# Patient Record
Sex: Male | Born: 1970 | Race: White | Hispanic: No | Smoking: Current every day smoker
Health system: Southern US, Community
[De-identification: ages and names within clinical notes are randomized; demographics above are authoritative.]

## PROBLEM LIST (undated history)

## (undated) DIAGNOSIS — K219 Gastro-esophageal reflux disease without esophagitis: Secondary | ICD-10-CM

## (undated) DIAGNOSIS — I10 Essential (primary) hypertension: Secondary | ICD-10-CM

## (undated) HISTORY — PX: NASAL SINUS SURGERY: SHX719

## (undated) HISTORY — PX: CERVICAL FUSION: SHX112

---

## 2007-05-10 ENCOUNTER — Ambulatory Visit: Payer: Self-pay | Admitting: Family Medicine

## 2007-05-10 DIAGNOSIS — M25529 Pain in unspecified elbow: Secondary | ICD-10-CM | POA: Insufficient documentation

## 2007-05-10 DIAGNOSIS — E162 Hypoglycemia, unspecified: Secondary | ICD-10-CM

## 2007-05-11 ENCOUNTER — Encounter: Payer: Self-pay | Admitting: Family Medicine

## 2007-05-11 LAB — CONVERTED CEMR LAB
Alkaline Phosphatase: 62 units/L (ref 39–117)
BUN: 14 mg/dL (ref 6–23)
CO2: 25 meq/L (ref 19–32)
Cholesterol: 199 mg/dL (ref 0–200)
Creatinine, Ser: 1.07 mg/dL (ref 0.40–1.50)
Glucose, Bld: 104 mg/dL — ABNORMAL HIGH (ref 70–99)
HDL: 29 mg/dL — ABNORMAL LOW (ref >39)
LDL Cholesterol: 158 mg/dL — ABNORMAL HIGH (ref 0–99)
Total Bilirubin: 0.6 mg/dL (ref 0.3–1.2)
Total Protein: 7.9 g/dL (ref 6.0–8.3)
Triglycerides: 61 mg/dL (ref <150)
VLDL: 12 mg/dL (ref 0–40)

## 2007-05-12 ENCOUNTER — Encounter: Payer: Self-pay | Admitting: Family Medicine

## 2007-05-18 ENCOUNTER — Ambulatory Visit: Payer: Self-pay | Admitting: Family Medicine

## 2007-05-31 ENCOUNTER — Ambulatory Visit: Payer: Self-pay | Admitting: Family Medicine

## 2007-06-28 ENCOUNTER — Ambulatory Visit: Payer: Self-pay | Admitting: Family Medicine

## 2007-06-30 ENCOUNTER — Telehealth (INDEPENDENT_AMBULATORY_CARE_PROVIDER_SITE_OTHER): Payer: Self-pay | Admitting: *Deleted

## 2007-06-30 ENCOUNTER — Encounter: Payer: Self-pay | Admitting: Family Medicine

## 2007-06-30 LAB — CONVERTED CEMR LAB: HCV Ab: NEGATIVE

## 2007-07-03 ENCOUNTER — Telehealth: Payer: Self-pay | Admitting: Family Medicine

## 2007-07-03 LAB — CONVERTED CEMR LAB
ALT: 91 units/L — ABNORMAL HIGH (ref 0–53)
GC Probe Amp, Urine: NEGATIVE

## 2007-07-06 ENCOUNTER — Encounter: Payer: Self-pay | Admitting: Family Medicine

## 2007-11-07 ENCOUNTER — Telehealth: Payer: Self-pay | Admitting: Family Medicine

## 2007-11-07 DIAGNOSIS — E785 Hyperlipidemia, unspecified: Secondary | ICD-10-CM

## 2007-11-15 ENCOUNTER — Encounter: Payer: Self-pay | Admitting: Family Medicine

## 2007-11-15 LAB — CONVERTED CEMR LAB
Cholesterol: 160 mg/dL (ref 0–200)
LDL Cholesterol: 101 mg/dL — ABNORMAL HIGH (ref 0–99)
Triglycerides: 65 mg/dL (ref <150)
VLDL: 13 mg/dL (ref 0–40)

## 2007-11-17 ENCOUNTER — Encounter: Payer: Self-pay | Admitting: Family Medicine

## 2008-01-11 ENCOUNTER — Ambulatory Visit: Payer: Self-pay | Admitting: Family Medicine

## 2008-01-11 DIAGNOSIS — J1189 Influenza due to unidentified influenza virus with other manifestations: Secondary | ICD-10-CM

## 2008-01-11 DIAGNOSIS — B009 Herpesviral infection, unspecified: Secondary | ICD-10-CM | POA: Insufficient documentation

## 2008-01-15 ENCOUNTER — Encounter: Payer: Self-pay | Admitting: Family Medicine

## 2008-01-15 ENCOUNTER — Telehealth: Payer: Self-pay | Admitting: Family Medicine

## 2008-01-19 ENCOUNTER — Encounter: Payer: Self-pay | Admitting: Family Medicine

## 2008-01-19 ENCOUNTER — Telehealth: Payer: Self-pay | Admitting: Family Medicine

## 2008-01-19 ENCOUNTER — Encounter: Admission: RE | Admit: 2008-01-19 | Discharge: 2008-01-19 | Payer: Self-pay | Admitting: Family Medicine

## 2008-03-07 IMAGING — CR DG CHEST 2V
3 series · 3 of 3 positions shown · non-contrast
Comparison: none

CLINICAL DATA: Cough.  
 CHEST X-RAY:

[view not recorded (1 of 3)]
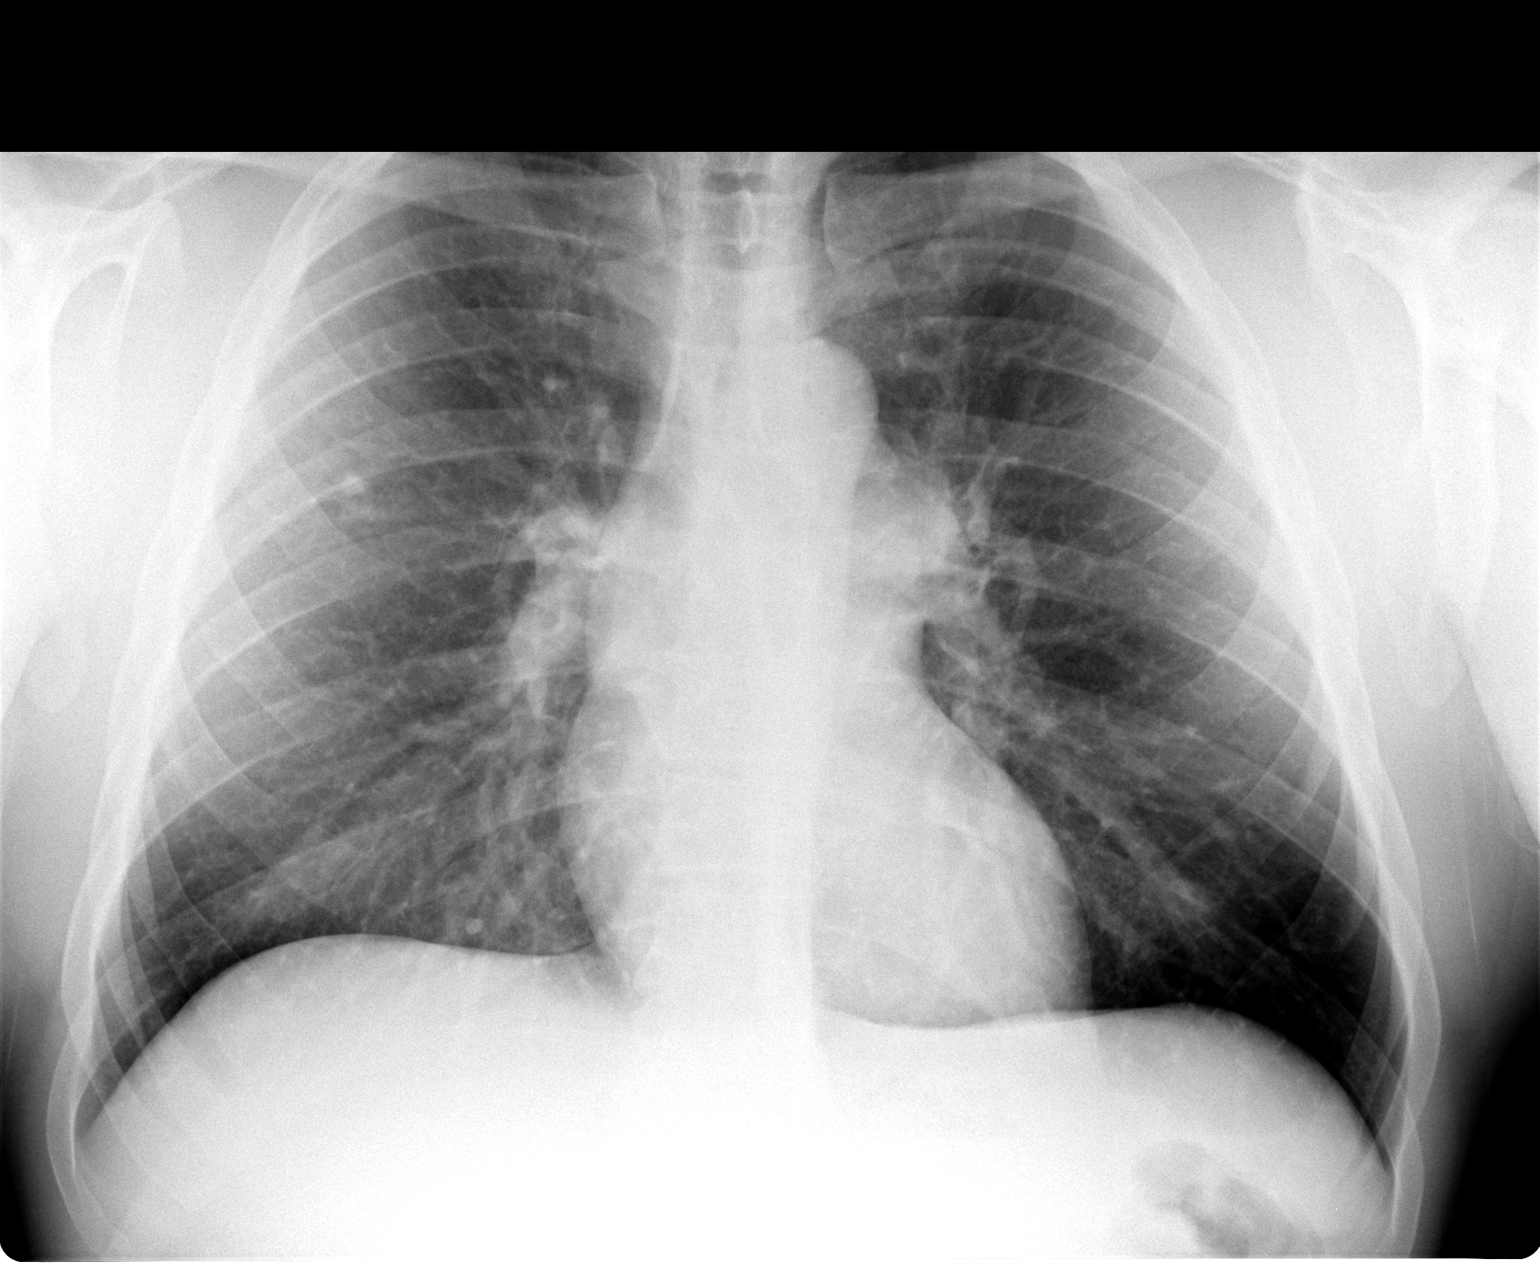

[view not recorded (2 of 3)]
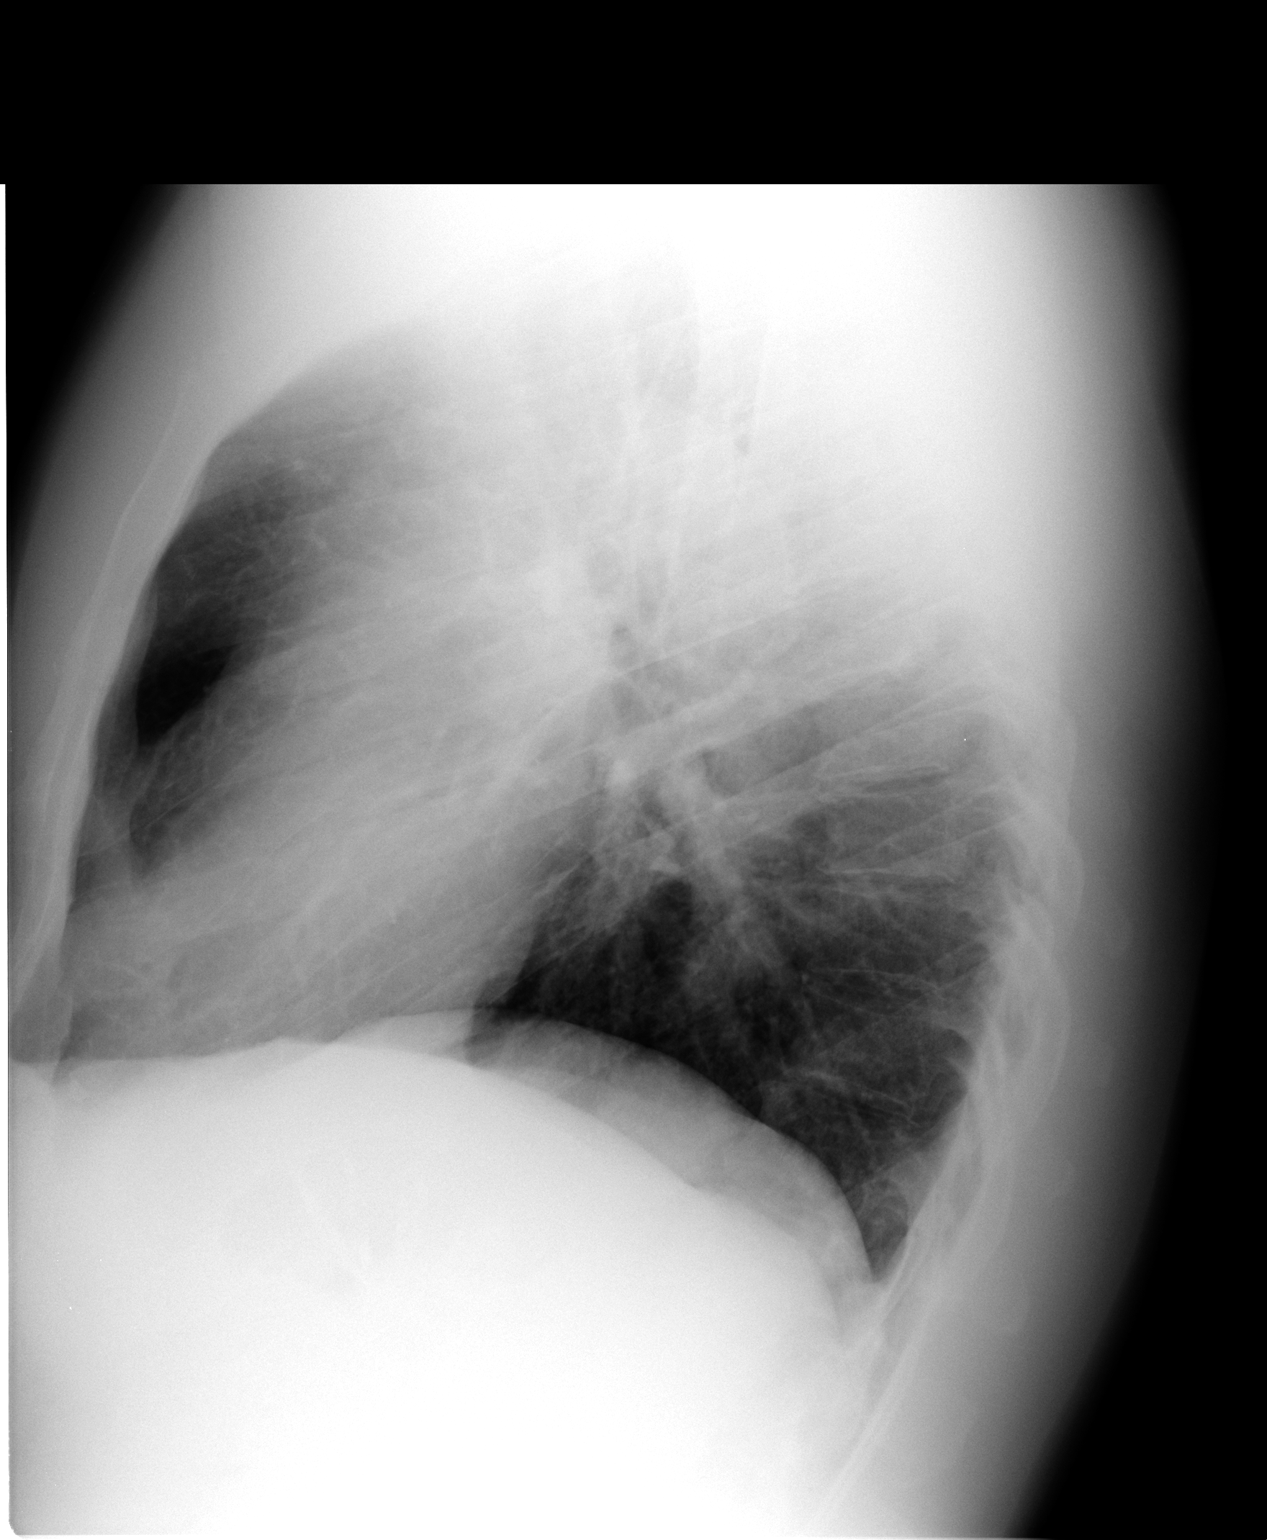

[view not recorded (3 of 3)]
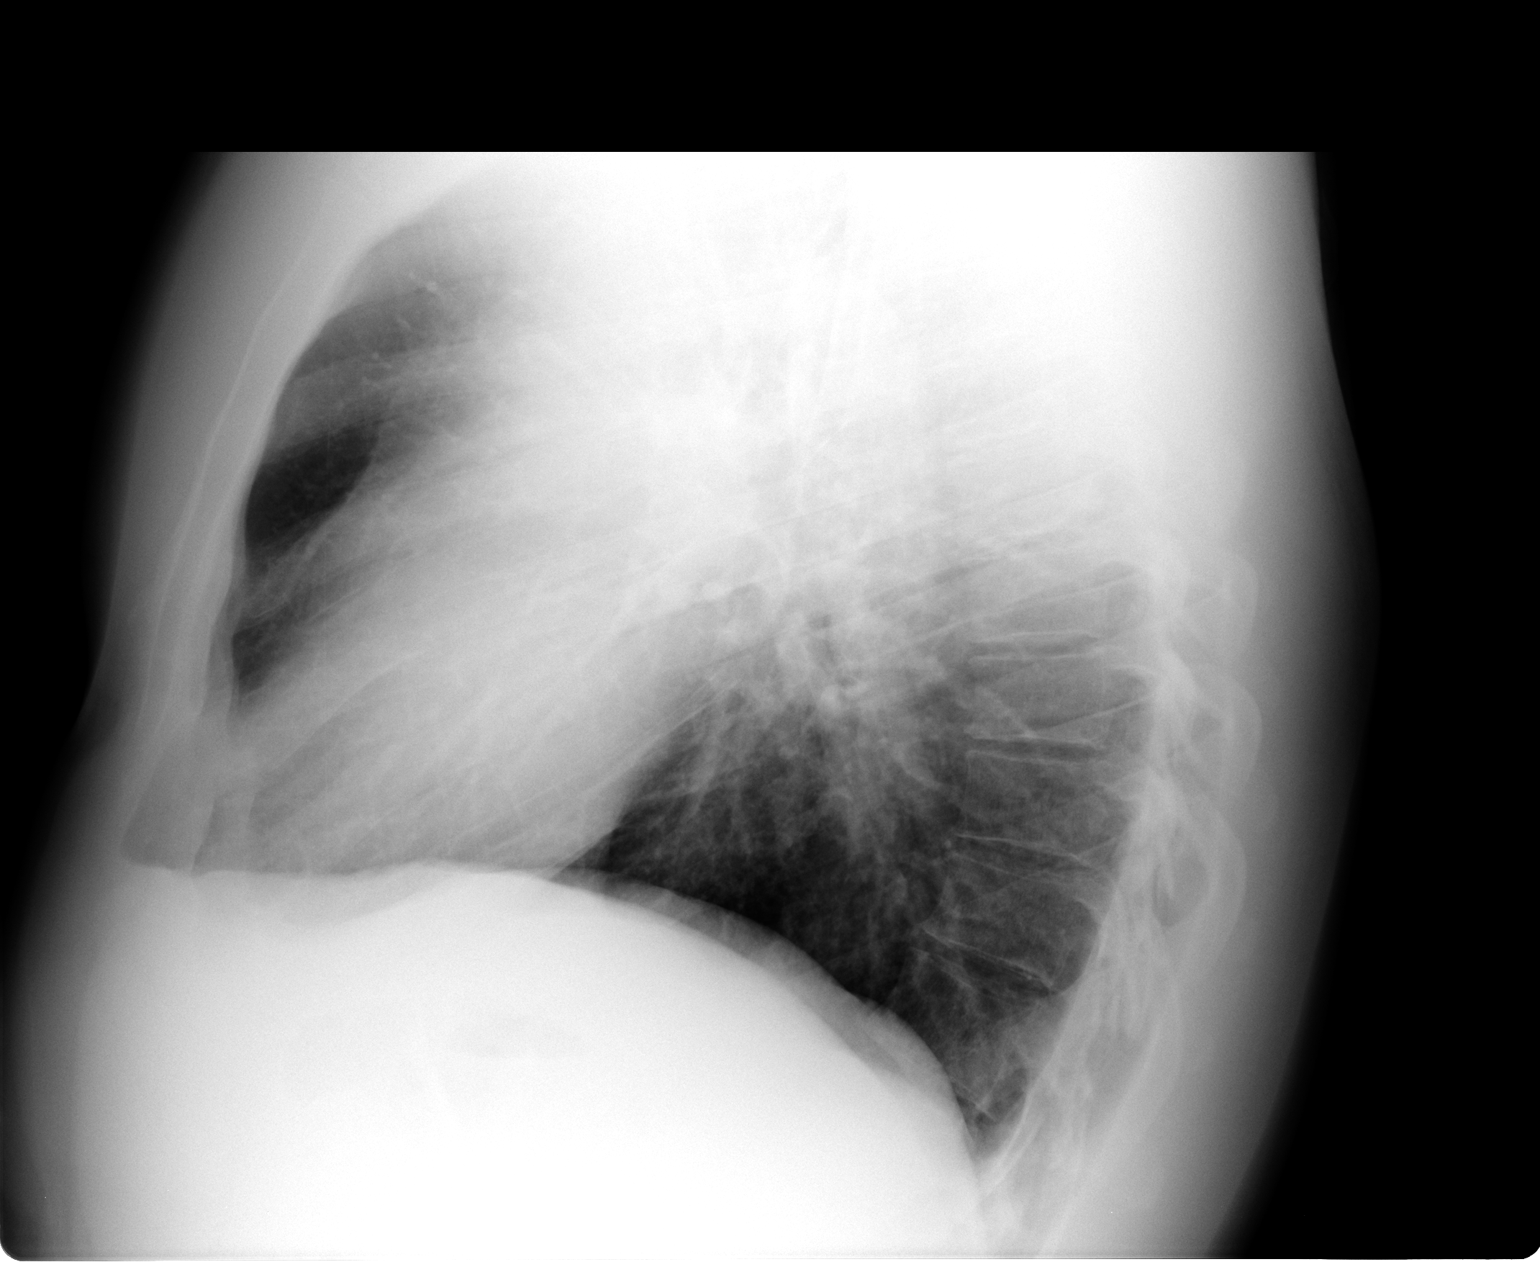

[3 of 3 positions shown; findings below may reference images not displayed]

FINDINGS: Two views of the chest show no pneumonia.  Probable granuloma is noted in the right mid lung possibly medially at the right lung base.  Recommend comparison with prior or follow up chest x-ray.  The heart is within normal limits in size.  Lower anterior cervical spine fusion plate is present.
IMPRESSION: No active lung disease.  Probable granulomas on the right.  Recommend compare with prior of follow up chest x-ray.

## 2008-04-05 ENCOUNTER — Encounter: Payer: Self-pay | Admitting: Family Medicine

## 2008-04-05 ENCOUNTER — Telehealth (INDEPENDENT_AMBULATORY_CARE_PROVIDER_SITE_OTHER): Payer: Self-pay | Admitting: *Deleted

## 2008-08-05 ENCOUNTER — Ambulatory Visit (HOSPITAL_COMMUNITY): Payer: Self-pay | Admitting: Psychiatry

## 2008-09-18 ENCOUNTER — Ambulatory Visit (HOSPITAL_COMMUNITY): Payer: Self-pay | Admitting: Psychiatry

## 2009-01-08 ENCOUNTER — Ambulatory Visit (HOSPITAL_COMMUNITY): Payer: Self-pay | Admitting: Psychiatry

## 2009-03-12 ENCOUNTER — Ambulatory Visit (HOSPITAL_COMMUNITY): Payer: Self-pay | Admitting: Psychiatry

## 2013-02-08 ENCOUNTER — Encounter: Payer: Self-pay | Admitting: *Deleted

## 2013-02-08 ENCOUNTER — Emergency Department
Admission: EM | Admit: 2013-02-08 | Discharge: 2013-02-08 | Disposition: A | Discharge status: Home / Self Care | Payer: Self-pay | Source: Home / Self Care | Attending: Family Medicine | Admitting: Family Medicine

## 2013-02-08 DIAGNOSIS — M940 Chondrocostal junction syndrome [Tietze]: Secondary | ICD-10-CM

## 2013-02-08 DIAGNOSIS — J069 Acute upper respiratory infection, unspecified: Secondary | ICD-10-CM

## 2013-02-08 HISTORY — DX: Gastro-esophageal reflux disease without esophagitis: K21.9

## 2013-02-08 MED ORDER — AZITHROMYCIN 250 MG PO TABS: ORAL_TABLET | ORAL | Status: DC

## 2013-02-08 MED ORDER — BENZONATATE 200 MG PO CAPS: 200.0000 mg | ORAL_CAPSULE | Freq: Every day | ORAL | Status: DC

## 2013-02-08 NOTE — ED Provider Notes (Signed)
History     CSN: [id number]  Arrival date & time 02/08/13  1335   First MD Initiated Contact with Patient 02/08/13 1410      Chief Complaint  Patient presents with  . Cough       HPI Comments: Patient complains of approximately 5 day history of gradually progressive URI symptoms beginning with a mild sore throat (now improved), followed by progressive nasal congestion.  A cough started next.  Complains of fatigue but no myalgias.  Cough is now worse at night and generally non-productive during the day.  There has been no shortness of breath, or wheezing, but he has noted distinct tightness in his anterior chest.  He has two daughters with similar illness.   The history is provided by the patient.    Past Medical History  Diagnosis Date  . GERD (gastroesophageal reflux disease)     Past Surgical History  Procedure Laterality Date  . Cervical fusion    . Nasal sinus surgery      History reviewed. No pertinent family history.  History  Substance Use Topics  . Smoking status: Former Games developer  . Smokeless tobacco: Not on file  . Alcohol Use: No      Review of Systems + mild sore throat + cough No pleuritic pain but has tightness in anterior chest No wheezing + nasal congestion + post-nasal drainage No sinus pain/pressure No itchy/red eyes No earache No hemoptysis No SOB + low grade fever/chills No nausea No vomiting No abdominal pain No diarrhea No urinary symptoms No skin rashes + fatigue + myalgias No headache Used OTC meds without relief  Allergies  Review of patient's allergies indicates no known allergies.  Home Medications   Current Outpatient Rx  Name  Route  Sig  Dispense  Refill  . omeprazole (PRILOSEC) 20 MG capsule   Oral   Take 20 mg by mouth daily.         Marland Kitchen azithromycin (ZITHROMAX Z-PAK) 250 MG tablet      Take 2 tabs today; then begin one tab once daily for 4 more days. (Rx void after 02/16/13)   6 each   0   . benzonatate  (TESSALON) 200 MG capsule   Oral   Take 1 capsule (200 mg total) by mouth at bedtime. Take as needed for cough   12 capsule   0     BP 113/79  Pulse 81  Temp(Src) 99 F (37.2 C) (Oral)  Ht 6\' 1"  (1.854 m)  Wt 237 lb (107.502 kg)  BMI 31.27 kg/m2  SpO2 97%  Physical Exam Nursing notes and Vital Signs reviewed. Appearance:  Patient appears healthy, stated age, and in no acute distress Eyes:  Pupils are equal, round, and reactive to light and accomodation.  Extraocular movement is intact.  Conjunctivae are not inflamed  Ears:  Canals normal.  Tympanic membranes normal.  Nose:  Mildly congested turbinates.  No sinus tenderness.  Pharynx:  Normal Neck:  Supple.  Nontender shotty posterior nodes are palpated bilaterally  Lungs:  Clear to auscultation.  Breath sounds are equal.  Chest:  Distinct tenderness to palpation over the mid-sternum.  Heart:  Regular rate and rhythm without murmurs, rubs, or gallops.  Abdomen:  Nontender without masses or hepatosplenomegaly.  Bowel sounds are present.  No CVA or flank tenderness.  Extremities:  No edema.  No calf tenderness Skin:  No rash present.   ED Course  Procedures  none      1. Acute  upper respiratory infections of unspecified site; suspect viral URI   2. Costochondritis, acute       MDM  There is no evidence of bacterial infection today.   Treat symptomatically for now.  Prescription written for Benzonatate [facility]) to take at bedtime for night-time cough.  Take Mucinex D (guaifenesin with decongestant) twice daily for congestion.  Increase fluid intake, rest. May use Afrin nasal spray (or generic oxymetazoline) twice daily for about 5 days.  Also recommend using saline nasal spray several times daily and saline nasal irrigation (AYR is a common brand) Stop all antihistamines for now, and other non-prescription cough/cold preparations. May take Ibuprofen 200mg , 4 tabs every 8 hours with food for chest/sternum  discomfort. Begin Azithromycin if not improving about 5 days or if persistent fever develops (Given a prescription to hold, with an expiration date)  Follow-up with family doctor if not improving 7 to 10 days.        Lattie Haw, MD 02/09/13 [phone]

## 2013-02-08 NOTE — ED Notes (Signed)
Pt c/o non productive cough, chest tightness, low grade fever, and body aches x 5 days. He has taken nyquil and dayquil.

## 2013-04-29 ENCOUNTER — Encounter: Payer: Self-pay | Admitting: Emergency Medicine

## 2013-04-29 ENCOUNTER — Emergency Department
Admission: EM | Admit: 2013-04-29 | Discharge: 2013-04-29 | Disposition: A | Discharge status: Home / Self Care | Payer: BC Managed Care – PPO | Source: Home / Self Care | Attending: Family Medicine | Admitting: Family Medicine

## 2013-04-29 DIAGNOSIS — J309 Allergic rhinitis, unspecified: Secondary | ICD-10-CM

## 2013-04-29 DIAGNOSIS — J3089 Other allergic rhinitis: Secondary | ICD-10-CM

## 2013-04-29 DIAGNOSIS — J0100 Acute maxillary sinusitis, unspecified: Secondary | ICD-10-CM

## 2013-04-29 HISTORY — DX: Essential (primary) hypertension: I10

## 2013-04-29 LAB — POCT RAPID STREP A (OFFICE): Rapid Strep A Screen: NEGATIVE

## 2013-04-29 MED ORDER — AMOXICILLIN 875 MG PO TABS: 875.0000 mg | ORAL_TABLET | Freq: Two times a day (BID) | ORAL | Status: DC

## 2013-04-29 MED ORDER — PREDNISONE 20 MG PO TABS: 20.0000 mg | ORAL_TABLET | Freq: Two times a day (BID) | ORAL | Status: DC

## 2013-04-29 MED ORDER — FLUTICASONE PROPIONATE 50 MCG/ACT NA SUSP: 2.0000 | Freq: Every day | NASAL | Status: DC

## 2013-04-29 MED ORDER — BENZONATATE 200 MG PO CAPS: 200.0000 mg | ORAL_CAPSULE | Freq: Every day | ORAL | Status: DC

## 2013-04-29 NOTE — ED Notes (Signed)
States he probably had sinus issues 3 weeks ago that almost cleared; presented again over the past week.

## 2013-04-29 NOTE — ED Provider Notes (Signed)
History     CSN: [id number]  Arrival date & time 04/29/13  1623   First MD Initiated Contact with Patient 04/29/13 1708      Chief Complaint  Patient presents with  . Nasal Congestion  . Cough  . Hoarse  . Generalized Body Aches       HPI Comments: Patient has a history of perennial allergies.  He had increased sinus congestion about 3 weeks ago that improved, but then over the past week he has developed low grade fever, increased sinus congestion, and a productive cough.  He had increased sore throat today.                                                                                                                                                                                                                        The history is provided by the patient.    Past Medical History  Diagnosis Date  . GERD (gastroesophageal reflux disease)   . Hypertension     Past Surgical History  Procedure Laterality Date  . Cervical fusion    . Nasal sinus surgery      Family History  Problem Relation Age of Onset  . Cancer Mother     History  Substance Use Topics  . Smoking status: Former Games developer  . Smokeless tobacco: Not on file  . Alcohol Use: No      Review of Systems + sore throat + cough No pleuritic pain No wheezing + nasal congestion + post-nasal drainage + sinus pain/pressure No itchy/red eyes No earache No hemoptysis No SOB + low grade fever, + chills No nausea No vomiting No abdominal pain No diarrhea No urinary symptoms No skin rashes + fatigue No myalgias + headache Used OTC meds without relief  Allergies  Claritin  Home Medications   Current Outpatient Rx  Name  Route  Sig  Dispense  Refill  . metoprolol (LOPRESSOR) 50 MG tablet   Oral   Take 50 mg by mouth.         Marland Kitchen amoxicillin (AMOXIL) 875 MG tablet   Oral   Take 1 tablet (875 mg total) by mouth 2 (two) times daily.   20 tablet   0   . benzonatate (TESSALON) 200 MG capsule  Oral   Take 1 capsule (200 mg total) by mouth at bedtime. Take as needed for cough   12 capsule   0   . fluticasone (FLONASE) 50 MCG/ACT nasal spray  Nasal   Place 2 sprays into the nose daily.   16 g   1   . omeprazole (PRILOSEC) 20 MG capsule   Oral   Take 20 mg by mouth daily.         . predniSONE (DELTASONE) 20 MG tablet   Oral   Take 1 tablet (20 mg total) by mouth 2 (two) times daily. Take with food.   10 tablet   0     BP 108/75  Pulse 90  Temp(Src) 98.7 F (37.1 C) (Oral)  Resp 16  Ht 6\' 1"  (1.854 m)  Wt 236 lb (107.049 kg)  BMI 31.14 kg/m2  SpO2 97%  Physical Exam Nursing notes and Vital Signs reviewed. Appearance:  Patient appears healthy, stated age, and in no acute distress Eyes:  Pupils are equal, round, and reactive to light and accomodation.  Extraocular movement is intact.  Conjunctivae are not inflamed  Ears:  Canals normal.  Tympanic membranes normal.  Nose:  Mildly congested turbinates.  Maxillary sinus tenderness is present.  Pharynx:  Normal Neck:  Supple.   Tender shotty anterior nodes are palpated bilaterally  Lungs:  Clear to auscultation.  Breath sounds are equal.  Heart:  Regular rate and rhythm without murmurs, rubs, or gallops.  Abdomen:  Nontender without masses or hepatosplenomegaly.  Bowel sounds are present.  No CVA or flank tenderness.  Extremities:  No edema.  No calf tenderness Skin:  No rash present.   ED Course  Procedures  none  Labs Reviewed  POCT RAPID STREP A (OFFICE) negative      1. Acute maxillary sinusitis   2. Perennial allergic rhinitis       MDM  Begin amoxicillin and prednisone burst.  Prescription written for Benzonatate (Tessalon) to take at bedtime for night-time cough.  Begin fluticasone nasal spray. Take plain Mucinex (guaifenesin) twice daily for cough and congestion.  Increase fluid intake, rest. May use Afrin nasal spray (or generic oxymetazoline) twice daily for about 5 days.  Also recommend  using saline nasal spray several times daily and saline nasal irrigation (AYR is a common brand).  Use prescription fluticasone spray after Afrin spray and saline rinse. Stop all antihistamines for now, and other non-prescription cough/cold preparations. Followup with ENT if not improving.        Lattie Haw, MD 04/30/13 [phone]

## 2013-04-30 MED ORDER — PREDNISONE 20 MG PO TABS: 20.0000 mg | ORAL_TABLET | Freq: Two times a day (BID) | ORAL | Status: DC

## 2013-04-30 MED ORDER — AMOXICILLIN 875 MG PO TABS: 875.0000 mg | ORAL_TABLET | Freq: Two times a day (BID) | ORAL | Status: DC

## 2013-04-30 MED ORDER — BENZONATATE 200 MG PO CAPS: 200.0000 mg | ORAL_CAPSULE | Freq: Every day | ORAL | Status: DC

## 2018-02-07 ENCOUNTER — Emergency Department (INDEPENDENT_AMBULATORY_CARE_PROVIDER_SITE_OTHER)
Admission: EM | Admit: 2018-02-07 | Discharge: 2018-02-07 | Disposition: A | Discharge status: Home / Self Care | Payer: BLUE CROSS/BLUE SHIELD | Source: Home / Self Care | Attending: Family Medicine | Admitting: Family Medicine

## 2018-02-07 ENCOUNTER — Other Ambulatory Visit: Payer: Self-pay

## 2018-02-07 ENCOUNTER — Encounter: Payer: Self-pay | Admitting: *Deleted

## 2018-02-07 DIAGNOSIS — R69 Illness, unspecified: Secondary | ICD-10-CM

## 2018-02-07 DIAGNOSIS — J111 Influenza due to unidentified influenza virus with other respiratory manifestations: Secondary | ICD-10-CM

## 2018-02-07 MED ORDER — BENZONATATE 200 MG PO CAPS: ORAL_CAPSULE | ORAL | 0 refills | Status: AC

## 2018-02-07 MED ORDER — DOXYCYCLINE HYCLATE 100 MG PO CAPS: 100.0000 mg | ORAL_CAPSULE | Freq: Two times a day (BID) | ORAL | 0 refills | Status: AC

## 2018-02-07 NOTE — ED Provider Notes (Signed)
[facility]    CSN: [id number] Arrival date & time: 02/07/18  1220     History   Chief Complaint Chief Complaint  Patient presents with  . Cough  . Nasal Congestion    HPI Thomas Sims is a 47 y.o. male.   Complains of 4 day history flu-like illness including myalgias, headache, fever/chills, fatigue, and cough.  Also has mild nasal congestion and sore throat.  Cough is non-productive and somewhat worse at night.  No pleuritic pain or shortness of breath.      The history is provided by the patient.    Past Medical History:  Diagnosis Date  . GERD (gastroesophageal reflux disease)   . Hypertension     Patient Active Problem List   Diagnosis Date Noted  . 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 INFLUENZA 01/11/2008  . DYSLIPIDEMIA 11/07/2007  . HYPOGLYCEMIA NOS 05/10/2007  . ELBOW PAIN, LEFT 05/10/2007    Past Surgical History:  Procedure Laterality Date  . CERVICAL FUSION    . NASAL SINUS SURGERY         Home Medications    Prior to Admission medications   Medication Sig Start Date End Date Taking? Authorizing Provider  buPROPion (WELLBUTRIN) 100 MG tablet Take 100 mg by mouth 2 (two) times daily.   Yes [provider]  benzonatate (TESSALON) 200 MG capsule Take one cap by mouth at bedtime as needed for cough.  May repeat in 4 to 6 hours 02/07/18   Lattie HawBeese, Teighlor Korson A, MD  doxycycline (VIBRAMYCIN) 100 MG capsule Take 1 capsule (100 mg total) by mouth 2 (two) times daily. Take with food. 02/07/18   Lattie HawBeese, Jaleiyah Alas A, MD  metoprolol (LOPRESSOR) 50 MG tablet Take 50 mg by mouth.    [provider]  omeprazole (PRILOSEC) 20 MG capsule Take 20 mg by mouth daily.    [provider]    Family History Family History  Problem Relation Age of Onset  . Cancer Mother     Social History Social History   Tobacco Use  . Smoking status: Current Every Day Smoker    Packs/day: 1.00    Types: Cigarettes  . Smokeless tobacco: Never Used    Substance Use Topics  . Alcohol use: No  . Drug use: No     Allergies   Claritin [loratadine]   Review of Systems Review of Systems + sore throat + cough No pleuritic pain No wheezing + nasal congestion + post-nasal drainage No sinus pain/pressure No itchy/red eyes No earache No hemoptysis No SOB ? fever, + chills + nausea No vomiting No abdominal pain No diarrhea No urinary symptoms No skin rash + fatigue + myalgias + headache Used OTC meds without relief   Physical Exam Triage Vital Signs ED Triage Vitals  Enc Vitals Group     BP 02/07/18 1345 116/83     Pulse Rate 02/07/18 1345 84     Resp 02/07/18 1345 18     Temp 02/07/18 1345 98 F (36.7 C)     Temp Source 02/07/18 1345 Oral     SpO2 02/07/18 1345 98 %     Weight 02/07/18 1346 214 lb (97.1 kg)     Height 02/07/18 1346 6\' 1"  (1.854 m)     Head Circumference --      Peak Flow --      Pain Score 02/07/18 1346 0     Pain Loc --      Pain Edu? --  Excl. in GC? --    No data found.  Updated Vital Signs BP 116/83 (BP Location: Right Arm)   Pulse 84   Temp 98 F (36.7 C) (Oral)   Resp 18   Ht 6\' 1"  (1.854 m)   Wt 214 lb (97.1 kg)   SpO2 98%   BMI 28.23 kg/m   Visual Acuity Right Eye Distance:   Left Eye Distance:   Bilateral Distance:    Right Eye Near:   Left Eye Near:    Bilateral Near:     Physical Exam Nursing notes and Vital Signs reviewed. Appearance:  Patient appears stated age, and in no acute distress Eyes:  Pupils are equal, round, and reactive to light and accomodation.  Extraocular movement is intact.  Conjunctivae are not inflamed  Ears:  Canals normal.  Tympanic membranes normal.  Nose:  Mildly congested turbinates.  No sinus tenderness.  Pharynx:  Normal Neck:  Supple.  Enlarged posterior/lateral nodes are palpated bilaterally, tender to palpation on the left.   Lungs:  Clear to auscultation.  Breath sounds are equal.  Moving air well. Heart:  Regular rate and  rhythm without murmurs, rubs, or gallops.  Abdomen:  Nontender without masses or hepatosplenomegaly.  Bowel sounds are present.  No CVA or flank tenderness.  Extremities:  No edema.  Skin:  No rash present.    UC Treatments / Results  Labs (all labs ordered are listed, but only abnormal results are displayed) Labs Reviewed - No data to display  EKG  EKG Interpretation None       Radiology No results found.  Procedures Procedures (including critical care time)  Medications Ordered in UC Medications - No data to display   Initial Impression / Assessment and Plan / UC Course  I have reviewed the triage vital signs and the nursing notes.  Pertinent labs & imaging results that were available during my care of the patient were reviewed by me and considered in my medical decision making (see chart for details).    Begin empiric doxycycline 100mg  BID. Prescription written for Benzonatate [facility]) to take at bedtime for night-time cough.  Take plain guaifenesin (1200mg  extended release tabs such as Mucinex) twice daily, with plenty of water, for cough and congestion.  Get adequate rest.   May use Afrin nasal spray (or generic oxymetazoline) each morning for about 5 days and then discontinue.  Also recommend using saline nasal spray several times daily and saline nasal irrigation (AYR is a common brand).  Use Flonase nasal spray each morning after using Afrin nasal spray and saline nasal irrigation. Try warm salt water gargles for sore throat.  Stop all antihistamines for now, and other non-prescription cough/cold preparations. May take Ibuprofen 200mg , 4 tabs every 8 hours with food for chest/sternum discomfort, body aches, etc. Followup with Family Doctor if not improved in about 10 days.    Final Clinical Impressions(s) / UC Diagnoses   Final diagnoses:  Influenza-like illness    ED Discharge Orders        Ordered    doxycycline (VIBRAMYCIN) 100 MG capsule  2 times daily      02/07/18 1409    benzonatate (TESSALON) 200 MG capsule     02/07/18 1409           Lattie Haw, MD 02/07/18 1413

## 2018-02-07 NOTE — ED Triage Notes (Signed)
Pt c/o productive cough, nasal congestion and hot/cold feeling x 3 days. No OTC meds.

## 2018-02-07 NOTE — Discharge Instructions (Signed)
Take plain guaifenesin (1200mg  extended release tabs such as Mucinex) twice daily, with plenty of water, for cough and congestion.  Get adequate rest.   May use Afrin nasal spray (or generic oxymetazoline) each morning for about 5 days and then discontinue.  Also recommend using saline nasal spray several times daily and saline nasal irrigation (AYR is a common brand).  Use Flonase nasal spray each morning after using Afrin nasal spray and saline nasal irrigation. Try warm salt water gargles for sore throat.  Stop all antihistamines for now, and other non-prescription cough/cold preparations. May take Ibuprofen 200mg , 4 tabs every 8 hours with food for chest/sternum discomfort, body aches, etc.
